# Patient Record
Sex: Male | Born: 1995 | Race: White | Hispanic: No | Marital: Single
Health system: Southern US, Community
[De-identification: ages and names within clinical notes are randomized; demographics above are authoritative.]

---

## 2017-06-14 ENCOUNTER — Ambulatory Visit
Admission: RE | Admit: 2017-06-14 | Discharge: 2017-06-14 | Disposition: A | Discharge status: Home / Self Care | Payer: Commercial Managed Care - PPO | Source: Ambulatory Visit | Attending: Nurse Practitioner | Admitting: Nurse Practitioner

## 2017-06-14 ENCOUNTER — Other Ambulatory Visit: Payer: Self-pay | Admitting: Nurse Practitioner

## 2017-06-14 ENCOUNTER — Ambulatory Visit
Admission: RE | Admit: 2017-06-14 | Discharge: 2017-06-14 | Disposition: A | Discharge status: Home / Self Care | Payer: Commercial Managed Care - PPO | Source: Ambulatory Visit | Attending: *Deleted | Admitting: *Deleted

## 2017-06-14 ENCOUNTER — Other Ambulatory Visit: Payer: Self-pay | Admitting: *Deleted

## 2017-06-14 DIAGNOSIS — R059 Cough, unspecified: Secondary | ICD-10-CM

## 2017-06-14 DIAGNOSIS — R05 Cough: Secondary | ICD-10-CM

## 2019-03-26 IMAGING — CR DG CHEST 2V
1 series · 2 of 2 positions shown · non-contrast
Comparison: None.

CLINICAL DATA: Cough for 1 month

EXAM:
CHEST - 2 VIEW

[Series 1: dg chest 2 view · 0.14mm/px · 2 of 2 slices shown]
[im 1/2]
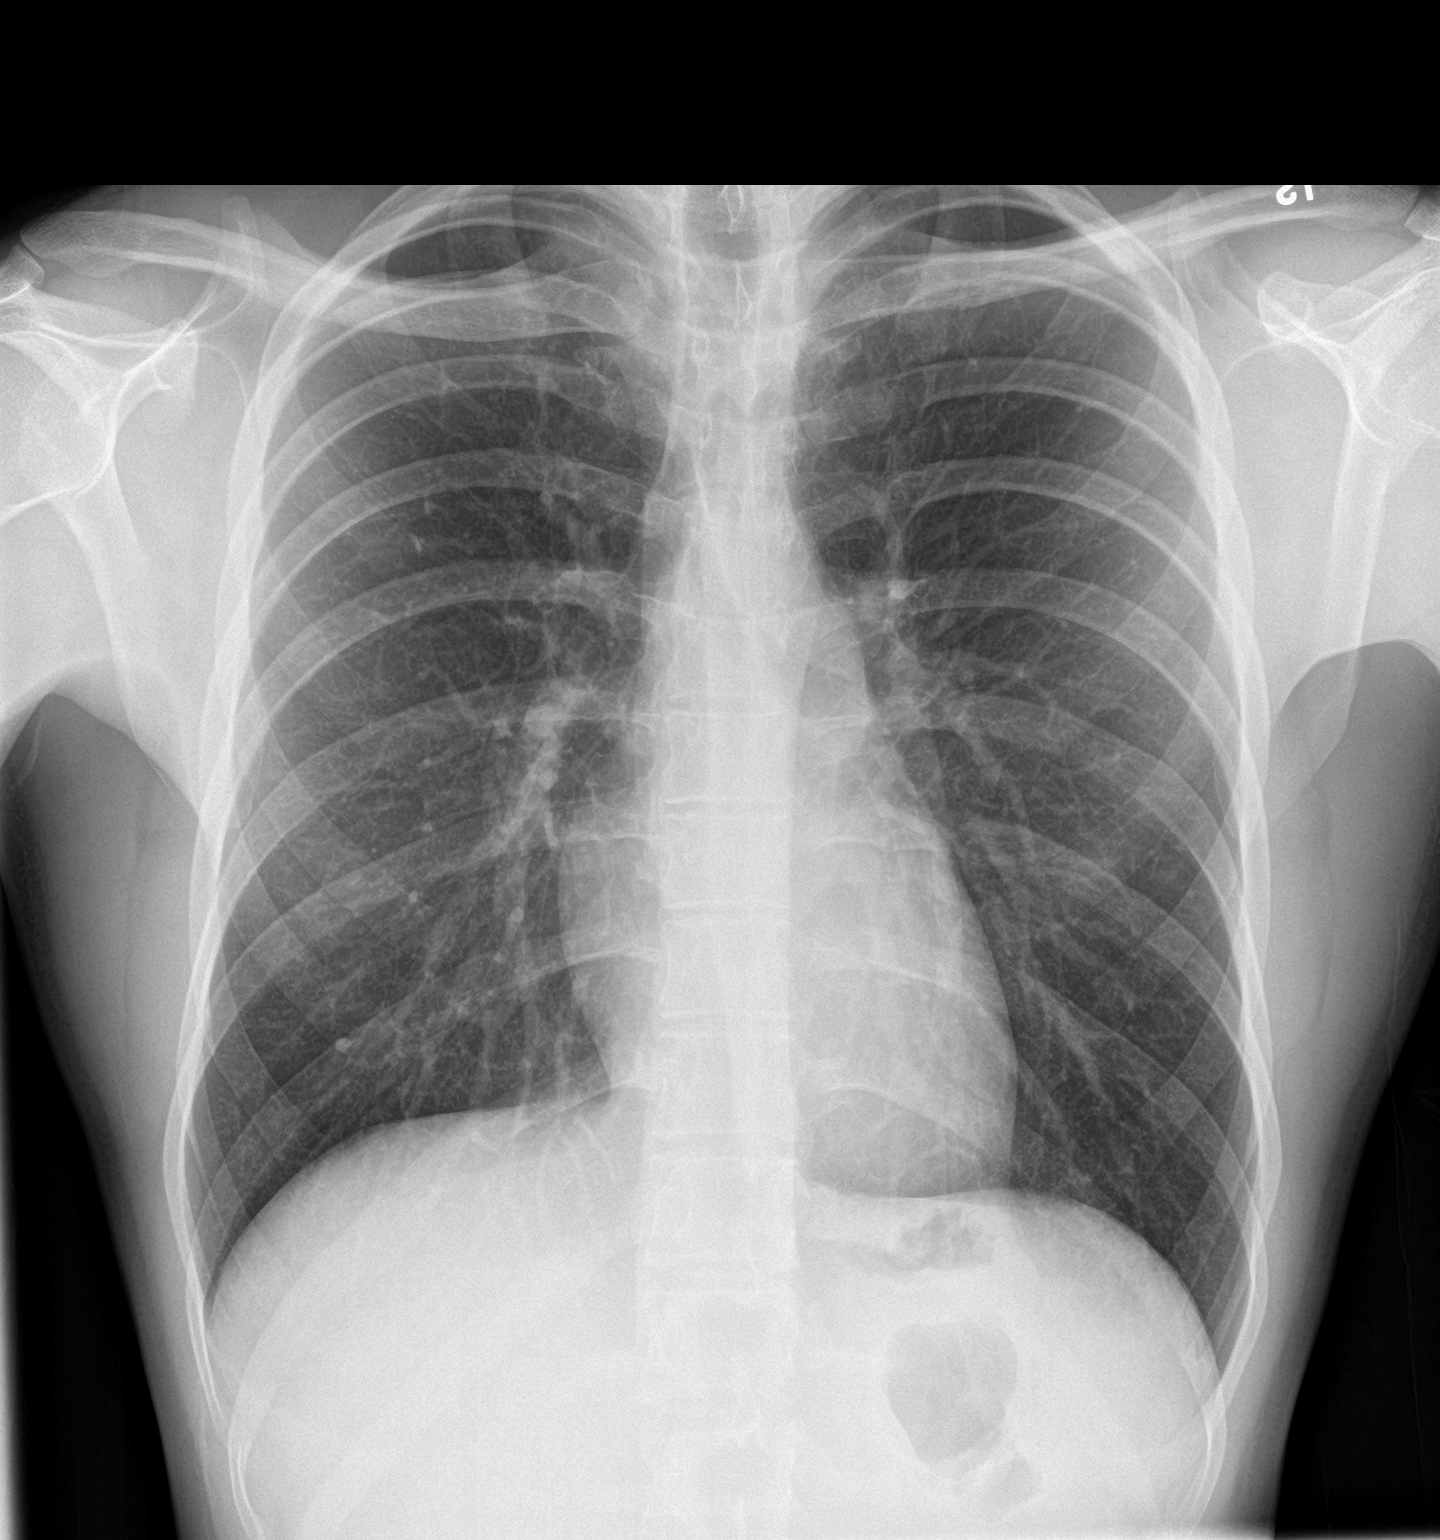
[im 2/2]
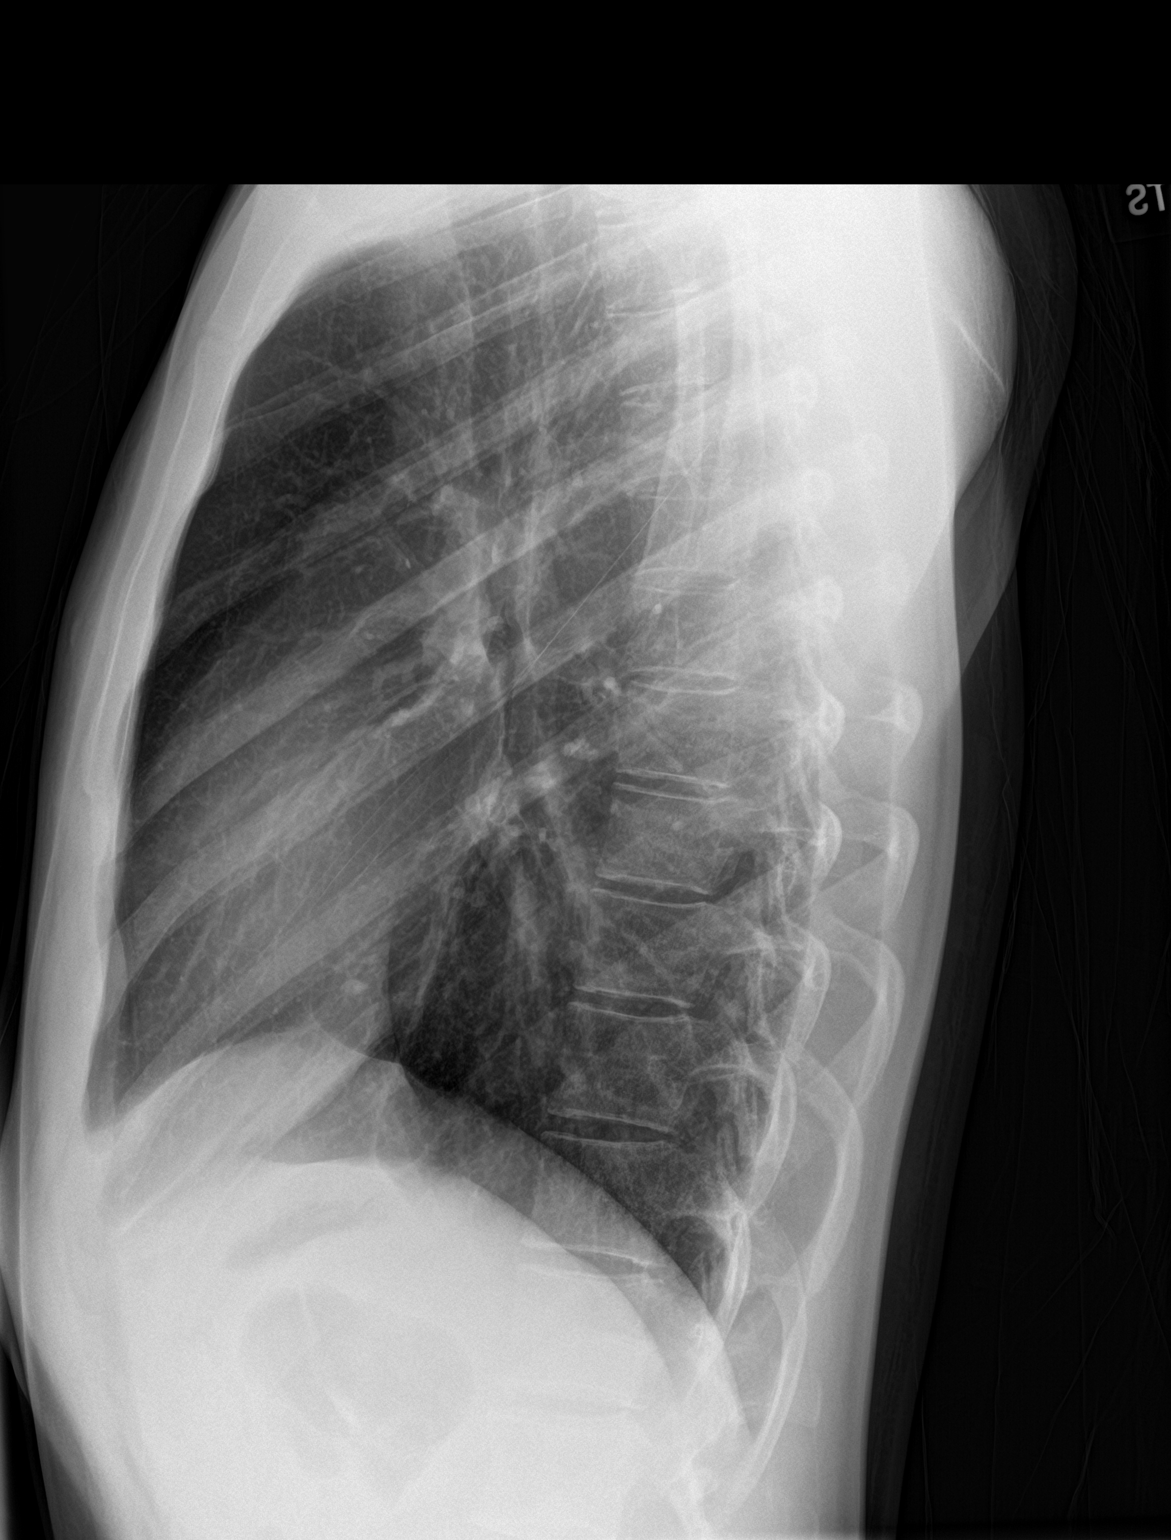

[2 of 2 positions shown; findings below may reference images not displayed]

FINDINGS: The heart size and mediastinal contours are within normal limits.
Both lungs are clear. The visualized skeletal structures are
unremarkable.
IMPRESSION: No active cardiopulmonary disease.

## 2019-04-14 ENCOUNTER — Ambulatory Visit: Payer: 59 | Attending: Internal Medicine

## 2019-04-14 DIAGNOSIS — Z23 Encounter for immunization: Secondary | ICD-10-CM | POA: Insufficient documentation

## 2019-04-14 NOTE — Progress Notes (Signed)
   Covid-[facility] Vaccination [facility]  Name:  Karl Gomez    MRN: [id number] DOB: [birth year]  04/14/2019  Karl Gomez was observed post Covid-19 immunization for  15 minutes without incidence. He was provided with Vaccine Information Sheet and instruction to access the V-Safe system.   Karl Gomez was instructed to call 911 with any severe reactions post vaccine: Marland Kitchen Difficulty breathing  . Swelling of your face and throat  . A fast heartbeat  . A bad rash all over your body  . Dizziness and weakness    Immunizations Administered    Name Date Dose VIS Date Route   Pfizer COVID-19 Vaccine 04/14/2019  9:13 AM 0.3 mL 02/09/2019 Intramuscular   Manufacturer: [employer], [employer]   Lot: [id number]   NDC: [address]-5

## 2019-05-06 ENCOUNTER — Ambulatory Visit: Payer: 59 | Attending: Internal Medicine

## 2019-05-06 DIAGNOSIS — Z23 Encounter for immunization: Secondary | ICD-10-CM | POA: Insufficient documentation

## 2019-05-06 NOTE — Progress Notes (Signed)
   Covid-19 Vaccination Clinic  Name:  Karl Gomez    MRN: [id number] DOB: 1995-08-28  05/06/2019  [name] was observed post Covid-19 immunization for 15 minutes without incident. He was provided with Vaccine Information Sheet and instruction to access the V-Safe system.   [name] was instructed to call 911 with any severe reactions post vaccine: Marland Kitchen Difficulty breathing  . Swelling of face and throat  . A fast heartbeat  . A bad rash all over body  . Dizziness and weakness   Immunizations Administered    Name Date Dose VIS Date Route   Pfizer COVID-19 Vaccine 05/06/2019  1:14 PM 0.3 mL 02/09/2019 Intramuscular   Manufacturer: [employer], [employer]   Lot: [id number]   NDC: [address]-3
